# Patient Record
Sex: Female | Born: 1980 | Smoking: Never smoker
Health system: Southern US, Community
[De-identification: ages and names within clinical notes are randomized; demographics above are authoritative.]

## PROBLEM LIST (undated history)

## (undated) DIAGNOSIS — Z789 Other specified health status: Secondary | ICD-10-CM

## (undated) HISTORY — PX: DILATION AND CURETTAGE OF UTERUS: SHX78

---

## 2014-04-09 ENCOUNTER — Other Ambulatory Visit (HOSPITAL_COMMUNITY)
Admission: RE | Admit: 2014-04-09 | Discharge: 2014-04-09 | Disposition: A | Discharge status: Home / Self Care | Payer: BC Managed Care – PPO | Source: Ambulatory Visit | Attending: Obstetrics and Gynecology | Admitting: Obstetrics and Gynecology

## 2014-04-09 DIAGNOSIS — Z01419 Encounter for gynecological examination (general) (routine) without abnormal findings: Secondary | ICD-10-CM | POA: Insufficient documentation

## 2014-04-09 DIAGNOSIS — Z1151 Encounter for screening for human papillomavirus (HPV): Secondary | ICD-10-CM | POA: Insufficient documentation

## 2014-11-03 ENCOUNTER — Ambulatory Visit
Admission: RE | Admit: 2014-11-03 | Discharge: 2014-11-03 | Disposition: A | Discharge status: Home / Self Care | Payer: BC Managed Care – PPO | Source: Ambulatory Visit | Attending: Family Medicine | Admitting: Family Medicine

## 2014-11-03 ENCOUNTER — Other Ambulatory Visit: Payer: Self-pay | Admitting: Family Medicine

## 2014-11-03 DIAGNOSIS — M545 Low back pain: Secondary | ICD-10-CM

## 2015-02-11 ENCOUNTER — Other Ambulatory Visit: Payer: Self-pay | Admitting: Family Medicine

## 2015-02-11 DIAGNOSIS — R748 Abnormal levels of other serum enzymes: Secondary | ICD-10-CM

## 2015-02-16 ENCOUNTER — Ambulatory Visit
Admission: RE | Admit: 2015-02-16 | Discharge: 2015-02-16 | Disposition: A | Discharge status: Home / Self Care | Payer: BLUE CROSS/BLUE SHIELD | Source: Ambulatory Visit | Attending: Family Medicine | Admitting: Family Medicine

## 2015-02-16 DIAGNOSIS — R748 Abnormal levels of other serum enzymes: Secondary | ICD-10-CM

## 2015-02-16 MED ORDER — IOPAMIDOL (ISOVUE-300) INJECTION 61%
100.0000 mL | Freq: Once | INTRAVENOUS | Status: AC | PRN
  Administered 2015-02-16: 100 mL via INTRAVENOUS

## 2015-06-28 ENCOUNTER — Other Ambulatory Visit: Payer: Self-pay | Admitting: Gastroenterology

## 2015-06-28 DIAGNOSIS — R1011 Right upper quadrant pain: Secondary | ICD-10-CM

## 2015-06-28 DIAGNOSIS — R112 Nausea with vomiting, unspecified: Secondary | ICD-10-CM

## 2015-07-18 ENCOUNTER — Ambulatory Visit (HOSPITAL_COMMUNITY): Payer: BLUE CROSS/BLUE SHIELD

## 2015-07-18 ENCOUNTER — Encounter (HOSPITAL_COMMUNITY): Payer: BLUE CROSS/BLUE SHIELD | Attending: Gastroenterology

## 2015-08-18 LAB — OB RESULTS CONSOLE HIV ANTIBODY (ROUTINE TESTING): HIV: NONREACTIVE

## 2015-08-18 LAB — OB RESULTS CONSOLE HEPATITIS B SURFACE ANTIGEN: HEP B S AG: NEGATIVE

## 2015-08-18 LAB — OB RESULTS CONSOLE ANTIBODY SCREEN: ANTIBODY SCREEN: NEGATIVE

## 2015-08-18 LAB — OB RESULTS CONSOLE RPR: RPR: NONREACTIVE

## 2015-08-18 LAB — OB RESULTS CONSOLE RUBELLA ANTIBODY, IGM: RUBELLA: IMMUNE

## 2015-08-18 LAB — OB RESULTS CONSOLE ABO/RH: RH Type: POSITIVE

## 2015-09-07 ENCOUNTER — Other Ambulatory Visit: Payer: Self-pay | Admitting: Obstetrics and Gynecology

## 2015-09-07 ENCOUNTER — Other Ambulatory Visit (HOSPITAL_COMMUNITY)
Admission: RE | Admit: 2015-09-07 | Discharge: 2015-09-07 | Disposition: A | Discharge status: Home / Self Care | Payer: BLUE CROSS/BLUE SHIELD | Source: Ambulatory Visit | Attending: Obstetrics and Gynecology | Admitting: Obstetrics and Gynecology

## 2015-09-07 DIAGNOSIS — Z01419 Encounter for gynecological examination (general) (routine) without abnormal findings: Secondary | ICD-10-CM | POA: Diagnosis present

## 2015-09-07 DIAGNOSIS — Z1151 Encounter for screening for human papillomavirus (HPV): Secondary | ICD-10-CM | POA: Insufficient documentation

## 2015-09-07 DIAGNOSIS — Z113 Encounter for screening for infections with a predominantly sexual mode of transmission: Secondary | ICD-10-CM | POA: Diagnosis present

## 2015-09-12 LAB — CYTOLOGY - PAP

## 2015-11-23 ENCOUNTER — Ambulatory Visit: Payer: BLUE CROSS/BLUE SHIELD

## 2015-11-29 ENCOUNTER — Ambulatory Visit: Payer: BLUE CROSS/BLUE SHIELD | Admitting: Physical Therapy

## 2015-11-30 ENCOUNTER — Ambulatory Visit: Payer: BLUE CROSS/BLUE SHIELD | Attending: Obstetrics and Gynecology | Admitting: Physical Therapy

## 2015-11-30 DIAGNOSIS — R293 Abnormal posture: Secondary | ICD-10-CM

## 2015-11-30 DIAGNOSIS — M5441 Lumbago with sciatica, right side: Secondary | ICD-10-CM | POA: Diagnosis present

## 2015-11-30 DIAGNOSIS — R29898 Other symptoms and signs involving the musculoskeletal system: Secondary | ICD-10-CM

## 2015-11-30 NOTE — Patient Instructions (Signed)
  Backward Bend (Standing)   Arch backward to make hollow of back deeper. Hold _1-2__ seconds. Repeat __10-15__ times per set. Do __1__ sets per session. Do _3-5___ sessions per day.  Copyright  VHI. All rights reserved.

## 2015-11-30 NOTE — Therapy (Signed)
[facility] [address] [place], [place], [address] Phone: [phone]   Fax:  [phone]  Physical Therapy Evaluation  Patient Details  Name: Tracy Gonzalez MRN: [id number] Date of Birth: June 23, 1981 Referring Provider: Gerald Leitz, MD  Encounter Date: 11/30/2015      PT End of Session - 11/30/15 1641    Visit Number 1   Number of Visits 16   Date for PT Re-Evaluation 01/25/16   PT Start Time 1555   PT Stop Time 1638   PT Time Calculation (min) 43 min   Activity Tolerance Patient tolerated treatment well;Patient limited by pain   Behavior During Therapy [facility] for tasks assessed/performed      No past medical history on file.  No past surgical history on file.  There were no vitals filed for this visit.  Visit Diagnosis:  Right-sided low back pain with right-sided sciatica - Plan: PT plan of care cert/re-cert  Weakness of right lower extremity - Plan: PT plan of care cert/re-cert  Abnormal posture - Plan: PT plan of care cert/re-cert      Subjective Assessment - 11/30/15 1559    Subjective Pt is a 34 y/o female who presents to OPPT with LBP x 10 years with increasing pain.  Pt is 24w pregnant as of evaluation, due 03/17/16.     Patient is accompained by: Family member  husband   Pertinent History [redacted] weeks pregnant   Limitations Standing;Walking   How long can you stand comfortably? 1-2 hours   How long can you walk comfortably? 10-15 min   Patient Stated Goals improve pain, ADLs and housework   Currently in Pain? Yes   Pain Score 6   worst: 8/10; best:4/10   Pain Location Back   Pain Orientation Right;Lower   Pain Descriptors / Indicators Constant   Pain Type Chronic pain   Pain Radiating Towards RLE to big toe   Pain Onset More than a month ago   Pain Frequency Constant   Aggravating Factors  standing, stairs, bending, standing after sitting for too long   Pain Relieving Factors repositioning, elevating LEs             [facility] PT Assessment - 11/30/15 1606    Assessment   Medical Diagnosis LBP, sciatica   Referring Provider Gerald Leitz, MD   Onset Date/Surgical Date --  10 year hx   Next MD Visit 4 weeks   Prior Therapy n/a   Precautions   Precautions Other (comment)   Precaution Comments pt is 24w pregnant   Balance Screen   Has the patient fallen in the past 6 months No   Has the patient had a decrease in activity level because of a fear of falling?  No   Is the patient reluctant to leave their home because of a fear of falling?  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   Vocation Requirements cares for 85 year old daughter   Cognition   Overall Cognitive Status Within Functional Limits for tasks assessed   Observation/Other Assessments   Focus on Therapeutic Outcomes (FOTO)  33 (67% limited; predicted 43% limited)   Posture/Postural Control   Posture/Postural Control Postural limitations   Postural Limitations Increased lumbar lordosis   AROM   AROM Assessment Site Lumbar   Lumbar Flexion 78  pain with returning to  neutral   Lumbar Extension 20   Lumbar - Right Side Bend 38   Lumbar - Left Side Bend 32   Strength   Overall Strength Comments limited assessment due to pregnancy; suspect R hip weakness as well   Strength Assessment Site Hip;Knee;Ankle   Right Hip Flexion 4/5   Left Hip Flexion 5/5   Right/Left Knee Right;Left   Right Knee Flexion 3+/5  tested in sitting   Right Knee Extension 5/5   Left Knee Flexion 5/5   Left Knee Extension 5/5   Right Ankle Dorsiflexion 4/5   Left Ankle Dorsiflexion 5/5   Flexibility   Soft Tissue Assessment /Muscle Length yes   Hamstrings tightness on R   Piriformis tightness on R   Palpation   Palpation comment tenderness along R SIJ and piriformis; hips level   Special Tests    Special Tests Lumbar    Lumbar Tests Straight Leg Raise   Straight Leg Raise   Findings Negative   Side  Right   Comment increased tightness noted                           PT Education - 11/30/15 1640    Education provided Yes   Education Details prenatal yoga; standing extension HEP   Person(s) Educated Patient   Methods Explanation;Demonstration;Handout   Comprehension Verbalized understanding;Returned demonstration;Need further instruction          PT Short Term Goals - 11/30/15 1644    PT SHORT TERM GOAL #1   Title verbalize understanding of posture/body mechanics to decrease risk of reinjury (12/29/15)   Time 4   Period Weeks   Status New   PT SHORT TERM GOAL #2   Title report pain < 5/10 with light housework for improved function (12/29/15)   Time 4   Period Weeks   Status New           PT Long Term Goals - 11/30/15 1645    PT LONG TERM GOAL #1   Title independent with HEP (01/25/16)   Time 8   Period Weeks   Status New   PT LONG TERM GOAL #2   Title report ability to walk > 30 min without increase in pain for improved mobility (01/25/16)   Time 8   Period Weeks   Status New   PT LONG TERM GOAL #3   Title perform lumbar ROM without increase in pain (01/25/16)   Time 8   Period Weeks   Status New   PT LONG TERM GOAL #4   Title report pain < 3/10 with housework for improved function (01/25/16)   Time 8   Period Weeks   Status New               Plan - 11/30/15 1641    Clinical Impression Statement Pt is a 34 y/o female who presents to OPPT with 10 year hx of LBP with RLE radicuar symptoms exacerbated by current pregnancy.  Pt demonstrated pain with lumbar ROM and decreased strength in RLE.  Pt will benefit from PT to maximize function and improve pain.   Pt will benefit from skilled therapeutic intervention in order to improve on the following deficits Pain;Postural dysfunction;Decreased strength;Decreased mobility;Decreased activity tolerance   Rehab  Potential Good   PT Frequency 2x / week   PT Duration 8 weeks   PT Treatment/Interventions ADLs/Self Care Home Management;Cryotherapy;Moist Heat;Therapeutic exercise;Therapeutic activities;Functional mobility training;Stair training;Patient/family education;Manual techniques  PT Next Visit Plan si stabilization, core strengthening   Consulted and Agree with Plan of Care Patient         Problem List There are no active problems to display for this patient.  Tracy Gonzalez, PT, DPT 11/30/2015 4:49 PM  [facility] [address] [place], [place], [address] Phone: [phone]   Fax:  [phone]  Name: Tracy Gonzalez MRN: [id number] Date of Birth: July 14, 1981

## 2015-12-09 ENCOUNTER — Ambulatory Visit: Payer: BLUE CROSS/BLUE SHIELD | Admitting: Physical Therapy

## 2015-12-09 DIAGNOSIS — R29898 Other symptoms and signs involving the musculoskeletal system: Secondary | ICD-10-CM

## 2015-12-09 DIAGNOSIS — M5441 Lumbago with sciatica, right side: Secondary | ICD-10-CM | POA: Diagnosis not present

## 2015-12-09 NOTE — Therapy (Signed)
[facility] [address] [place], [place], [address] Phone: [phone]   Fax:  [phone]  Physical Therapy Treatment  Patient Details  Name: Tracy RampRahmet Matzen MRN: [id number] Date of Birth: Feb 01, 1981 Referring Provider: Gerald Leitzara Cole, MD  Encounter Date: 12/09/2015      PT End of Session - 12/09/15 1210    Visit Number 2   Number of Visits 16   PT Start Time 1022   PT Stop Time 1100   PT Time Calculation (min) 38 min      No past medical history on file.  No past surgical history on file.  There were no vitals filed for this visit.  Visit Diagnosis:  Right-sided low back pain with right-sided sciatica  Weakness of right lower extremity      Subjective Assessment - 12/09/15 1023    Subjective Has been doing home exercises standing extension.  helped a little but not much     Currently in Pain? Yes   Pain Location Back   Pain Orientation Right;Left   Pain Descriptors / Indicators Constant   Pain Radiating Towards RT leg   Pain Frequency Constant   Aggravating Factors  Standing   Pain Relieving Factors sitting briefly                         [facility] Adult PT Treatment/Exercise - 12/09/15 0001    Knee/Hip Exercises: Seated   Ball Squeeze --  tried did not help pain   Clamshell with TheraBand --  10 X red band   Marching Limitations Isometric hip flexion LT 10 X added to home   Hamstring Curl 10 reps  issued for home red band, lt only   Abd/Adduction Limitations red band issued,  helped pain some assed to home     Self care.  Skeleton used to explain SI Issues.           PT Education - 12/09/15 1209    Education provided Yes   Education Details hamstring culr LT, hip flex isometric LT, hip abduction   Person(s) Educated Patient;Spouse   Methods Explanation;Demonstration   Comprehension Verbalized understanding          PT Short Term Goals - 12/09/15 1215    PT SHORT TERM GOAL  #1   Title verbalize understanding of posture/body mechanics to decrease risk of reinjury (12/29/15)   Time 4   Period Weeks   Status On-going   PT SHORT TERM GOAL #2   Title report pain < 5/10 with light housework for improved function (12/29/15)   Baseline varies sometimes 9/10   Time 4   Period Weeks   Status On-going           PT Long Term Goals - 11/30/15 1645    PT LONG TERM GOAL #1   Title independent with HEP (01/25/16)   Time 8   Period Weeks   Status New   PT LONG TERM GOAL #2   Title report ability to walk > 30 min without increase in pain for improved mobility (01/25/16)   Time 8   Period Weeks   Status New   PT LONG TERM GOAL #3   Title perform lumbar ROM without increase in pain (01/25/16)   Time 8   Period Weeks   Status New   PT LONG TERM GOAL #4   Title report pain < 3/10 with housework for improved function (01/25/16)   Time 8  Period Weeks   Status New               Plan - 12/09/15 1211    Clinical Impression Statement some exercises helped symptoms so added them to home exercise.  Rt ASIS seemed a little posterior today.  .  Pain unchanged at the end of the session.   PT Next Visit Plan review stabilization   PT Home Exercise Plan isometric hip flexion, band abd, hamstring curl LT   Consulted and Agree with Plan of Care Patient;Family member/caregiver   Family Member Consulted Husband        Problem List There are no active problems to display for this patient.   [facility] 12/09/2015, 12:17 PM  [facility] [address] [place], [place], [address] Phone: [phone]   Fax:  [phone]  Name: Lendy Dittrich MRN: [id number] Date of Birth: 1981-09-08    Liz Beach, PTA 12/09/2015 12:17 PM Phone: [phone] Fax: [phone]

## 2015-12-15 ENCOUNTER — Ambulatory Visit: Payer: BLUE CROSS/BLUE SHIELD | Admitting: Physical Therapy

## 2015-12-15 DIAGNOSIS — M5441 Lumbago with sciatica, right side: Secondary | ICD-10-CM | POA: Diagnosis not present

## 2015-12-15 DIAGNOSIS — R29898 Other symptoms and signs involving the musculoskeletal system: Secondary | ICD-10-CM

## 2015-12-15 DIAGNOSIS — R293 Abnormal posture: Secondary | ICD-10-CM

## 2015-12-15 NOTE — Patient Instructions (Addendum)
   Copyright  VHI. All rights reserved.  Abdominal Bracing With Pelvic Floor (Hook-Lying)    With neutral spine, tighten pelvic floor and abdominals. Repeat _10__ times. Do _2__ times a day. CAn also do on your side if that is more comfy  Copyright  VHI. All rights reserved.  Abduction: Clam (Eccentric) - Side-Lying    Lie on side with knees bent. Lift top knee, keeping feet together. Keep trunk steady. Slowly lower for 3-5 seconds. _10-20__ reps per set, __1-2_ sets per day, __5_ days per week.   http://ecce.exer.us/65   Copyright  VHI. All rights reserved.  Angry Cat Stretch    Tuck chin and tighten stomach, arching back. Repeat __10__ times per set. Do __1__ sets per session. Do __2__ sessions per day.  http://orth.exer.us/119   Copyright  VHI. All rights reserved.

## 2015-12-15 NOTE — Therapy (Signed)
[facility] [place] [facility] [address] [place], [place], [address] Phone: [phone]   Fax:  [phone]  Physical Therapy Treatment  Patient Details  Name: Tracy Gonzalez MRN: [id number] Date of Birth: [birth year] Referring Provider: Gerald Leitz, MD  Encounter Date: 12/15/2015      PT End of Session - 12/15/15 1224    Visit Number 3   Number of Visits 16   Date for PT Re-Evaluation 01/25/16   PT Start Time 1116   PT Stop Time 1215   PT Time Calculation (min) 59 min   Activity Tolerance Patient tolerated treatment well   Behavior During Therapy Tracy Gonzalez for tasks assessed/performed      No past medical history on file.  No past surgical history on file.  There were no vitals filed for this visit.  Visit Diagnosis:  Right-sided low back pain with right-sided sciatica  Weakness of right lower extremity  Abnormal posture      Subjective Assessment - 12/15/15 1117    Subjective I always have pain.    Currently in Pain? Yes   Pain Score 4    Pain Location Back   Pain Orientation Right   Pain Descriptors / Indicators Constant;Aching   Pain Type Chronic pain   Pain Onset More than a month ago   Pain Frequency Constant   Aggravating Factors  sitting long periods    Pain Relieving Factors heat,    Multiple Pain Sites No           [facility] Adult PT Treatment/Exercise - 12/15/15 1235    Self-Care   Posture sitting with lumbar support, firm surface   Other Self-Care Comments  tennis ball, HEP and plan for post baby   Lumbar Exercises: Supine   Ab Set 10 reps   AB Set Limitations done in semi-reclined and sidelying   Clam 10 reps   Clam Limitations sidelying   Other Supine Lumbar Exercises A/P pelvic tilt x 10    Lumbar Exercises: Quadruped   Madcat/Old Horse 5 reps   Madcat/Old Horse Limitations HEP   Moist Heat Therapy   Number Minutes Moist Heat 15 Minutes   Moist Heat Location Lumbar Spine;Hip   Manual Therapy   Soft  tissue mobilization Rt. gluteal and lumbar parapsinal   Myofascial Release Rt. trunk in sidelying        Pt with multiple questions regarding condition, anatomy           PT Education - 12/15/15 1220    Education provided Yes   Education Details anatomy (L spine, SIJ) and tennis ball for husband to massage, options for post. baby   Person(s) Educated Patient;Spouse   Methods Explanation;Demonstration   Comprehension Verbalized understanding;Returned demonstration          PT Short Term Goals - 12/15/15 1231    PT SHORT TERM GOAL #1   Title verbalize understanding of posture/body mechanics to decrease risk of reinjury (12/29/15)   Status On-going   PT SHORT TERM GOAL #2   Title report pain < 5/10 with light housework for improved function (12/29/15)   Status On-going           PT Long Term Goals - 12/15/15 1231    PT LONG TERM GOAL #1   Title independent with HEP (01/25/16)   Status On-going   PT LONG TERM GOAL #2   Title report ability to walk > 30 min without increase in pain for improved mobility (01/25/16)   Status On-going  PT LONG TERM GOAL #3   Title perform lumbar ROM without increase in pain (01/25/16)   Status On-going   PT LONG TERM GOAL #4   Title report pain < 3/10 with housework for improved function (01/25/16)   Status On-going               Plan - 12/15/15 1225    Clinical Impression Statement Patient cont with pain, recently drove from [place] and even with stopping had severe back and leg pain.  Today she appeared symmetrical.  Able to begin basic stabilization, responded well to gentle soft tissue to Rt. gluteal/lumbar.   PT Next Visit Plan review stabilization and answer questions, ask about lifting (does she lift and how)   PT Home Exercise Plan isometric hip flexion, band abd, hamstring curl LT, gave isometric Tr A , sidelying clam and quadruped   Consulted and Agree with Plan of Care Patient;Family member/caregiver   Family Member  Consulted Husband        Problem List There are no active problems to display for this patient.   PAA,JENNIFER 12/15/2015, 12:39 PM  [facility] [address] [place], [place], [address] Phone: [phone]   Fax:  [phone]  Name: Tracy Gonzalez MRN: [id number] Date of Birth: October 26, 1981   Karie MainlandJennifer Paa, PT 12/15/2015 12:45 PM Phone: [phone] Fax: [phone]

## 2015-12-18 NOTE — L&D Delivery Note (Addendum)
Delivery Note At 3:11 AM, on March 21, 2016, a viable female "Halil Darin Engelsbraham" was delivered via Vaginal, Spontaneous Delivery (Presentation: Left Occiput Anterior with restitution to LOT). After delivery of head, bulb suction given on perineum for moderate amt of MSAF. Shoulders delivered easily and infant with good tone and spontaneous cry. Tactile stimulation given by provider and infant placed on mother's abdomen where nurse continued tactile stimulation.  Infant APGAR: 8, 9. Cord clamped, cut, and blood collected. Placenta delivered spontaneously and noted to be intact with 3VC upon inspection.  Vaginal inspection revealed a 2nd degree perineal laceration that was repaired with 3-0 vicryl on CT-1. Local anesthetic was necessary and 1% lidocaine was given. Patient tolerated the procedure well. Fundus firm, below the umbilicus, and bleeding scant.  Mother hemodynamically stable and infant skin to skin prior to provider exit.  Mother desires for birth control not addressed, but she does opt to breastfeed.  Family wishes for infant to be circumcised during inpatient stay. Infant weight at one hour of life: 8lb 2.5oz, 21in.   Anesthesia: Local  Episiotomy: None Lacerations: 2nd degree Suture Repair: 3.0 vicryl Est. Blood Loss (mL): 50  Mom to postpartum.  Baby to Couplet care / Skin to Skin.  Cherre RobinsJessica L Cailee Blanke MSN, CNM 03/21/2016, 4:02 AM

## 2015-12-20 ENCOUNTER — Ambulatory Visit: Payer: BLUE CROSS/BLUE SHIELD | Attending: Obstetrics and Gynecology | Admitting: Physical Therapy

## 2015-12-20 DIAGNOSIS — R29898 Other symptoms and signs involving the musculoskeletal system: Secondary | ICD-10-CM | POA: Diagnosis present

## 2015-12-20 DIAGNOSIS — M5441 Lumbago with sciatica, right side: Secondary | ICD-10-CM | POA: Diagnosis not present

## 2015-12-20 DIAGNOSIS — R293 Abnormal posture: Secondary | ICD-10-CM | POA: Insufficient documentation

## 2015-12-20 NOTE — Therapy (Signed)
[place], [place], [address] Phone: [phone]   Fax:  [phone]  Physical Therapy Treatment  Patient Details  Name: Tracy Gonzalez MRN: [id number] Date of Birth: [birth year] Referring Provider: Christophe Louis, MD  Encounter Date: 12/20/2015      PT End of Session - 12/20/15 1602    Visit Number 4   Number of Visits 16   Date for PT Re-Evaluation 01/25/16   PT Start Time 0254   PT Stop Time 1630   PT Time Calculation (min) 45 min   Activity Tolerance Patient tolerated treatment well   Behavior During Therapy [facility] for tasks assessed/performed      No past medical history on file.  No past surgical history on file.  There were no vitals filed for this visit.  Visit Diagnosis:  Right-sided low back pain with right-sided sciatica  Weakness of right lower extremity  Abnormal posture      Subjective Assessment - 12/20/15 1550    Subjective Is a little bit worse.  Was traveling and had a houseguest recently so overdid it.  Had a hard time doing sidelying clam.  Difficulty getting out of bed in the am, takes a few min to be able to walk normally. Has had back pain for yrs but only in the past month has it been in post hip and buttocks as much as this.    Currently in Pain? Yes   Pain Score 4    Pain Location Back   Pain Orientation Right   Pain Type Chronic pain   Pain Onset More than a month ago   Pain Frequency Constant   Aggravating Factors  standing, doing too much, sitting too much   Pain Relieving Factors lying on side, heat, rest                         [facility] Adult PT Treatment/Exercise - 12/20/15 1600    Self-Care   Posture handouts, breif review      Other Self-Care Comments  lifting with golfer's technique, can demo    Lumbar Exercises: Supine   Ab Set 10 reps  ball squeeze   AB Set Limitations semi-reclined with wedge   Clam 20 reps   Clam Limitations red band in  supine    Other Supine Lumbar Exercises A/P pelvic tilt x 10    Other Supine Lumbar Exercises sidelying QL stretch, glute stretch    Cryotherapy   Number Minutes Cryotherapy 10 Minutes   Cryotherapy Location Hip   Type of Cryotherapy Ice pack   Manual Therapy   Manual therapy comments compression to Rt. ischial tuberosity, hamstring and lateral SI border, piriformis   Soft tissue mobilization Rt. gluteal   Myofascial Release Rt. trunk in sidelying      Educated on lifting, posture/self care as ice pack applied Advised to wear SI belt as needed when up and out of bed.  Stressed relief of pain at this point due to pregnancy, need to rest.            PT Education - 12/20/15 1559    Education provided Yes   Education Details lifting principles, body mech   Person(s) Educated Patient;Spouse   Methods Explanation;Demonstration;Handout   Comprehension Verbalized understanding;Returned demonstration          PT Short Term Goals - 12/15/15 1231    PT SHORT TERM GOAL #1   Title verbalize understanding of posture/body mechanics  to decrease risk of reinjury (12/29/15)   Status On-going   PT SHORT TERM GOAL #2   Title report pain < 5/10 with light housework for improved function (12/29/15)   Status On-going           PT Long Term Goals - 12/15/15 1231    PT LONG TERM GOAL #1   Title independent with HEP (01/25/16)   Status On-going   PT LONG TERM GOAL #2   Title report ability to walk > 30 min without increase in pain for improved mobility (01/25/16)   Status On-going   PT LONG TERM GOAL #3   Title perform lumbar ROM without increase in pain (01/25/16)   Status On-going   PT LONG TERM GOAL #4   Title report pain < 3/10 with housework for improved function (01/25/16)   Status On-going               Plan - 12/20/15 2206    Clinical Impression Statement Pt today appears Rt. anterior innominate rotation.  worked on [employer] and positioning to relive pain, educated on Office manager  for lifting.  Trial of ice due to inflammatory response to palpation of Rt. hip mm.    PT Next Visit Plan review stabilization and answer questions, ask about lifting (does she lift and how). try a MET (resisted extension) in supine followed by passive hip flexion/post innominate, needs seated hip stretch (Ham, piriformis)   PT Home Exercise Plan (stopped isometirc hip flexion due to incr pain),  clam in supine,  hamstring curl LT, gave isometric Tr A , sidelying clam and quadruped   Consulted and Agree with Plan of Care Patient;Family member/caregiver        Problem List There are no active problems to display for this patient.   PAA,JENNIFER 12/20/2015, 10:18 PM  [place], [place], [address] Phone: [phone]   Fax:  [phone]  Name: Tracy Gonzalez MRN: [id number] Date of Birth: 09/04/1981   Raeford Razor, PT 12/20/2015 10:23 PM Phone: [phone] Fax: [phone]

## 2015-12-20 NOTE — Patient Instructions (Addendum)

## 2015-12-22 ENCOUNTER — Encounter: Payer: BLUE CROSS/BLUE SHIELD | Admitting: Physical Therapy

## 2015-12-27 ENCOUNTER — Ambulatory Visit: Payer: BLUE CROSS/BLUE SHIELD | Admitting: Physical Therapy

## 2015-12-27 DIAGNOSIS — R29898 Other symptoms and signs involving the musculoskeletal system: Secondary | ICD-10-CM

## 2015-12-27 DIAGNOSIS — M5441 Lumbago with sciatica, right side: Secondary | ICD-10-CM | POA: Diagnosis not present

## 2015-12-27 DIAGNOSIS — R293 Abnormal posture: Secondary | ICD-10-CM

## 2015-12-27 NOTE — Therapy (Signed)
[place], [place], [address] Phone: [phone]   Fax:  [phone]  Physical Therapy Treatment  Patient Details  Name: Tracy Gonzalez MRN: [id number] Date of Birth: 1981/02/06 Referring Provider: Christophe Louis, MD  Encounter Date: 12/27/2015      PT End of Session - 12/27/15 1600    Visit Number 5   Number of Visits 16   Date for PT Re-Evaluation 01/25/16   PT Start Time 8185   PT Stop Time 1640   PT Time Calculation (min) 53 min   Activity Tolerance Patient tolerated treatment well   Behavior During Therapy [facility] for tasks assessed/performed      No past medical history on file.  No past surgical history on file.  There were no vitals filed for this visit.  Visit Diagnosis:  Right-sided low back pain with right-sided sciatica  Weakness of right lower extremity  Abnormal posture      Subjective Assessment - 12/27/15 1553    Subjective Pt reports she believes she is better with pain rating 3-4/10 upon arrival. Upon lying supine, pt reports pain increased again.    Currently in Pain? Yes   Pain Score 3    Pain Location Back   Pain Orientation Right   Pain Descriptors / Indicators Aching;Constant   Pain Type Chronic pain              [facility] Adult PT Treatment/Exercise - 12/27/15 1555    Self-Care   Self-Care Other Self-Care Comments   Posture used time during manual to discuss tools for home use for relieving pain in Rt. glute   Other Self-Care Comments  exercise techniue and rationale  pain with exercise, advised to work through discomfort, RICE   Lumbar Exercises: Supine   Ab Set 10 reps  ball squeeze   AB Set Limitations supine   Clam --   Clam Limitations --   Heel Slides 10 reps  with verbal cues for breathing technique   Bent Knee Raise 10 reps   Bridge 10 reps   Bridge Limitations with belt to stabilize   Other Supine Lumbar Exercises A/P pelvic tilt x 10    Other  Supine Lumbar Exercises LTR x 10 reps    Lumbar Exercises: Sidelying   Clam 10 reps   Clam Limitations red   Hip Abduction 10 reps   Hip Abduction Weights (lbs) red band   Hip Abduction Limitations knee and hip flexed   Knee/Hip Exercises: Stretches   Active Hamstring Stretch 30 seconds   Active Hamstring Stretch Limitations 3, reps , bilat   Piriformis Stretch 3 reps;30 seconds   Piriformis Stretch Limitations --   Gastroc Stretch 3 reps;30 seconds   Gastroc Stretch Limitations --   Cryotherapy   Number Minutes Cryotherapy 10 Minutes   Cryotherapy Location Hip   Type of Cryotherapy Ice pack   Manual Therapy   Soft tissue mobilization R glute in sidelying   Myofascial Release R glute with rolling stick                PT Education - 12/27/15 1559    Education provided Yes   Education Details breath control during ther ex, HEP with piriformis stretch   Person(s) Educated Patient;Spouse   Methods Explanation;Demonstration   Comprehension Verbalized understanding;Returned demonstration          PT Short Term Goals - 12/27/15 1946    PT SHORT TERM GOAL #1   Title verbalize understanding  of posture/body mechanics to decrease risk of reinjury (12/29/15)   Status Achieved   PT SHORT TERM GOAL #2   Title report pain < 5/10 with light housework for improved function (12/29/15)   Baseline varies, ongoing for consistency   Status On-going           PT Long Term Goals - 12/27/15 1946    PT LONG TERM GOAL #1   Title independent with HEP (01/25/16)   Status On-going   PT LONG TERM GOAL #2   Title report ability to walk > 30 min without increase in pain for improved mobility (01/25/16)   Status On-going   PT LONG TERM GOAL #3   Title perform lumbar ROM without increase in pain (01/25/16)   Status On-going   PT LONG TERM GOAL #4   Title report pain < 3/10 with housework for improved function (01/25/16)   Status On-going               Plan - 12/27/15 1944     Clinical Impression Statement Pt was able to rest this week and appears more comfortable. Has been wearing belt.  Pain increased with gentle stabilization ex.  Has difficulty maintaining neutral pelvic with stab ex.  Ice reduced pain post treatment.     PT Next Visit Plan review stabilization and answer questions, ask about lifting (does she lift and how). try a MET (resisted extension) in supine followed by passive hip flexion/post innominate, needs seated hip stretch (Ham, piriformis)   PT Home Exercise Plan (stopped isometirc hip flexion due to incr pain),  clam in supine,  hamstring curl LT, gave isometric Tr A , sidelying clam and quadruped, piriformis seated   Consulted and Agree with Plan of Care Patient;Family member/caregiver        Problem List There are no active problems to display for this patient.   PAA,JENNIFER 12/27/2015, 7:52 PM  [facility] [address] [place], [place], [address] Phone: [phone]   Fax:  [phone]  Name: Tracy Gonzalez MRN: [id number] Date of Birth: Nov 14, 1981  Raeford Razor, PT 12/27/2015 7:53 PM Phone: [phone] Fax: [phone]

## 2015-12-29 ENCOUNTER — Ambulatory Visit: Payer: BLUE CROSS/BLUE SHIELD | Admitting: Physical Therapy

## 2015-12-29 DIAGNOSIS — R29898 Other symptoms and signs involving the musculoskeletal system: Secondary | ICD-10-CM

## 2015-12-29 DIAGNOSIS — M5441 Lumbago with sciatica, right side: Secondary | ICD-10-CM | POA: Diagnosis not present

## 2015-12-29 DIAGNOSIS — R293 Abnormal posture: Secondary | ICD-10-CM

## 2015-12-30 NOTE — Therapy (Addendum)
[place], [place], [address] Phone: [phone]   Fax:  [phone]  Physical Therapy Treatment  Patient Details  Name: Tracy Gonzalez MRN: [id number] Date of Birth: 1981/02/03 Referring Provider: Christophe Louis, MD  Encounter Date: 12/29/2015      PT End of Session - 12/30/15 0801    Visit Number 6   Number of Visits 16   Date for PT Re-Evaluation 01/25/16   PT Start Time 0177   PT Stop Time 1636   PT Time Calculation (min) 51 min   Activity Tolerance Patient tolerated treatment well   Behavior During Therapy [facility] for tasks assessed/performed      No past medical history on file.  No past surgical history on file.  There were no vitals filed for this visit.  Visit Diagnosis:  Right-sided low back pain with right-sided sciatica  Weakness of right lower extremity  Abnormal posture      Subjective Assessment - 12/29/15 1549    Subjective Was doing good until she gave the baby a bath, had to bend and help her. Report bilateral leg cramps at night.     Currently in Pain? Yes   Pain Score 6    Pain Location Back   Pain Orientation Right   Pain Type Chronic pain            [facility] PT Assessment - 12/29/15 1601    Posture/Postural Control   Postural Limitations Increased lumbar lordosis;Left pelvic obliquity   Posture Comments L ASIS higher than Rt. ASIS (Rt. anterior)                      [facility] Adult PT Treatment/Exercise - 12/29/15 1601    Lumbar Exercises: Quadruped   Madcat/Old Horse 10 reps   Madcat/Old Horse Limitations also done laterallty for each side x 3    Plank childs pose see above   wags for lateral flexion x 5 each side    Knee/Hip Exercises: Stretches   Gastroc Stretch 3 reps   Manual Therapy   Manual therapy comments MET for Rt. ant ilium, resisted hip ext and knee flexion in supine, 10 sec x 6    Soft tissue mobilization Rt. Lumbar parapsipinals, Rt. glute  med and piriformis in sidelying    Myofascial Release Rt. trunk                 PT Education - 12/30/15 0800    Education provided Yes   Education Details MET, calf stretch   Person(s) Educated Patient   Methods Explanation   Comprehension Verbalized understanding;Returned demonstration          PT Short Term Goals - 12/27/15 1946    PT SHORT TERM GOAL #1   Title verbalize understanding of posture/body mechanics to decrease risk of reinjury (12/29/15)   Status Achieved   PT SHORT TERM GOAL #2   Title report pain < 5/10 with light housework for improved function (12/29/15)   Baseline varies, ongoing for consistency   Status On-going           PT Long Term Goals - 12/27/15 1946    PT LONG TERM GOAL #1   Title independent with HEP (01/25/16)   Status On-going   PT LONG TERM GOAL #2   Title report ability to walk > 30 min without increase in pain for improved mobility (01/25/16)   Status On-going   PT LONG TERM GOAL #3  Title perform lumbar ROM without increase in pain (01/25/16)   Status On-going   PT LONG TERM GOAL #4   Title report pain < 3/10 with housework for improved function (01/25/16)   Status On-going               Plan - 12/30/15 0802    Clinical Impression Statement Pt with relief of LE pain with manual STM in sidelying.  Understands the need to avoid aggravating factors but with a 35 yr old it is difficult.  Does her HEP everyday.  Trial of MET for Rt. ant, L post ilium today   PT Next Visit Plan review stabilization and answer questions, ask about lifting (does she lift and how). try a MET (resisted extension) in supine followed by passive hip flexion/post innominate, needs seated hip stretch (Ham, piriformis)   PT Home Exercise Plan MET for Rt. ant pelvis, clam in supine,  hamstring curl LT, gave isometric Tr A , sidelying clam and quadruped, piriformis seated, stand calf stretch   Consulted and Agree with Plan of Care Patient;Family member/caregiver    Family Member Consulted Husband        Problem List There are no active problems to display for this patient.   PAA,JENNIFER 12/30/2015, 8:46 AM  [facility] [address] [place], [place], [address] Phone: [phone]   Fax:  [phone]  Name: Jamae Gonzalez MRN: [id number] Date of Birth: [birth year]   Raeford Razor, PT 12/30/2015 8:47 AM Phone: [phone] Fax: [phone]   PHYSICAL THERAPY DISCHARGE SUMMARY  Visits from Start of Care: 6  Current functional level related to goals / functional outcomes: See above   Remaining deficits: unknown   Education / Equipment: HEP, stabilization, posture  Plan: Patient agrees to discharge.  Patient goals were partially met. Patient is being discharged due to not returning since the last visit.  ?????    Raeford Razor, PT 03/16/2016 11:45 AM Phone: [phone] Fax: [phone]

## 2016-01-03 ENCOUNTER — Encounter: Payer: BLUE CROSS/BLUE SHIELD | Admitting: Physical Therapy

## 2016-01-05 ENCOUNTER — Encounter: Payer: BLUE CROSS/BLUE SHIELD | Admitting: Physical Therapy

## 2016-02-20 LAB — OB RESULTS CONSOLE GBS: STREP GROUP B AG: NEGATIVE

## 2016-03-20 ENCOUNTER — Inpatient Hospital Stay (HOSPITAL_COMMUNITY)
Admission: AD | Admit: 2016-03-20 | Discharge: 2016-03-20 | Disposition: A | Discharge status: Home / Self Care | Payer: BLUE CROSS/BLUE SHIELD | Source: Ambulatory Visit | Attending: Obstetrics and Gynecology | Admitting: Obstetrics and Gynecology

## 2016-03-20 ENCOUNTER — Encounter (HOSPITAL_COMMUNITY): Payer: Self-pay | Admitting: *Deleted

## 2016-03-20 ENCOUNTER — Telehealth (HOSPITAL_COMMUNITY): Payer: Self-pay | Admitting: *Deleted

## 2016-03-20 HISTORY — DX: Other specified health status: Z78.9

## 2016-03-20 NOTE — Telephone Encounter (Signed)
Preadmission screen Interpreter number [id number]

## 2016-03-20 NOTE — MAU Note (Signed)
Pt presents complaining of contractions every 5 minutes since this am. Denies leaking or bleeding. Reports good fetal movement. 1cm in office yesterday.

## 2016-03-20 NOTE — Progress Notes (Signed)
May D/C home with labor precautions

## 2016-03-21 ENCOUNTER — Encounter (HOSPITAL_COMMUNITY): Payer: Self-pay

## 2016-03-21 ENCOUNTER — Inpatient Hospital Stay (HOSPITAL_COMMUNITY)
Admission: AD | Admit: 2016-03-21 | Discharge: 2016-03-22 | DRG: 775 | Disposition: A | Discharge status: Home / Self Care | Payer: BLUE CROSS/BLUE SHIELD | Source: Ambulatory Visit | Attending: Obstetrics and Gynecology | Admitting: Obstetrics and Gynecology

## 2016-03-21 DIAGNOSIS — Z8249 Family history of ischemic heart disease and other diseases of the circulatory system: Secondary | ICD-10-CM

## 2016-03-21 DIAGNOSIS — Z3A40 40 weeks gestation of pregnancy: Secondary | ICD-10-CM | POA: Diagnosis not present

## 2016-03-21 DIAGNOSIS — O4202 Full-term premature rupture of membranes, onset of labor within 24 hours of rupture: Principal | ICD-10-CM | POA: Diagnosis present

## 2016-03-21 LAB — CBC
HCT: 35.3 % — ABNORMAL LOW (ref 36.0–46.0)
Hemoglobin: 12.3 g/dL (ref 12.0–15.0)
MCH: 29.6 pg (ref 26.0–34.0)
MCHC: 34.8 g/dL (ref 30.0–36.0)
MCV: 85.1 fL (ref 78.0–100.0)
Platelets: 207 10*3/uL (ref 150–400)
RBC: 4.15 MIL/uL (ref 3.87–5.11)
RDW: 15.8 % — AB (ref 11.5–15.5)
WBC: 8.8 10*3/uL (ref 4.0–10.5)

## 2016-03-21 LAB — TYPE AND SCREEN
ABO/RH(D): B POS
ANTIBODY SCREEN: NEGATIVE

## 2016-03-21 LAB — ABO/RH: ABO/RH(D): B POS

## 2016-03-21 LAB — RPR: RPR Ser Ql: NONREACTIVE

## 2016-03-21 MED ORDER — CITRIC ACID-SODIUM CITRATE 334-500 MG/5ML PO SOLN: 30.0000 mL | ORAL | Status: DC | PRN

## 2016-03-21 MED ORDER — PRENATAL MULTIVITAMIN CH
1.0000 | ORAL_TABLET | Freq: Every day | ORAL | Status: DC
  Administered 2016-03-22: 1 via ORAL
  Filled 2016-03-21 (×2): qty 1

## 2016-03-21 MED ORDER — LANOLIN HYDROUS EX OINT: TOPICAL_OINTMENT | CUTANEOUS | Status: DC | PRN

## 2016-03-21 MED ORDER — SENNOSIDES-DOCUSATE SODIUM 8.6-50 MG PO TABS
2.0000 | ORAL_TABLET | ORAL | Status: DC
  Administered 2016-03-21: 2 via ORAL
  Filled 2016-03-21: qty 2

## 2016-03-21 MED ORDER — SIMETHICONE 80 MG PO CHEW: 80.0000 mg | CHEWABLE_TABLET | ORAL | Status: DC | PRN

## 2016-03-21 MED ORDER — ACETAMINOPHEN 325 MG PO TABS
650.0000 mg | ORAL_TABLET | ORAL | Status: DC | PRN
  Administered 2016-03-21 (×3): 650 mg via ORAL
  Filled 2016-03-21 (×3): qty 2

## 2016-03-21 MED ORDER — BENZOCAINE-MENTHOL 20-0.5 % EX AERO
1.0000 | INHALATION_SPRAY | CUTANEOUS | Status: DC | PRN
Start: 2016-03-21 — End: 2016-03-22
  Administered 2016-03-21: 1 via TOPICAL
  Filled 2016-03-21: qty 56

## 2016-03-21 MED ORDER — FENTANYL CITRATE (PF) 100 MCG/2ML IJ SOLN: 50.0000 ug | INTRAMUSCULAR | Status: DC | PRN

## 2016-03-21 MED ORDER — LACTATED RINGERS IV SOLN
INTRAVENOUS | Status: DC
  Administered 2016-03-21: 03:00:00 via INTRAVENOUS
  Administered 2016-03-21: 125 mL/h via INTRAVENOUS

## 2016-03-21 MED ORDER — ONDANSETRON HCL 4 MG/2ML IJ SOLN: 4.0000 mg | INTRAMUSCULAR | Status: DC | PRN

## 2016-03-21 MED ORDER — OXYTOCIN 10 UNIT/ML IJ SOLN
2.5000 [IU]/h | INTRAVENOUS | Status: DC
  Filled 2016-03-21: qty 4

## 2016-03-21 MED ORDER — PHENYLEPHRINE 40 MCG/ML (10ML) SYRINGE FOR IV PUSH (FOR BLOOD PRESSURE SUPPORT)
PREFILLED_SYRINGE | INTRAVENOUS | Status: AC
  Filled 2016-03-21: qty 20

## 2016-03-21 MED ORDER — ACETAMINOPHEN 325 MG PO TABS: 650.0000 mg | ORAL_TABLET | ORAL | Status: DC | PRN

## 2016-03-21 MED ORDER — LACTATED RINGERS IV SOLN: 500.0000 mL | INTRAVENOUS | Status: DC | PRN

## 2016-03-21 MED ORDER — DIBUCAINE 1 % RE OINT
1.0000 | TOPICAL_OINTMENT | RECTAL | Status: DC | PRN
Start: 2016-03-21 — End: 2016-03-22
  Administered 2016-03-21: 1 via RECTAL
  Filled 2016-03-21: qty 28

## 2016-03-21 MED ORDER — ONDANSETRON HCL 4 MG/2ML IJ SOLN: 4.0000 mg | Freq: Four times a day (QID) | INTRAMUSCULAR | Status: DC | PRN

## 2016-03-21 MED ORDER — ONDANSETRON HCL 4 MG PO TABS: 4.0000 mg | ORAL_TABLET | ORAL | Status: DC | PRN

## 2016-03-21 MED ORDER — DIPHENHYDRAMINE HCL 25 MG PO CAPS: 25.0000 mg | ORAL_CAPSULE | Freq: Four times a day (QID) | ORAL | Status: DC | PRN

## 2016-03-21 MED ORDER — OXYTOCIN BOLUS FROM INFUSION
500.0000 mL | INTRAVENOUS | Status: DC
  Administered 2016-03-21: 500 mL via INTRAVENOUS

## 2016-03-21 MED ORDER — TETANUS-DIPHTH-ACELL PERTUSSIS 5-2.5-18.5 LF-MCG/0.5 IM SUSP: 0.5000 mL | Freq: Once | INTRAMUSCULAR | Status: DC

## 2016-03-21 MED ORDER — FENTANYL 2.5 MCG/ML BUPIVACAINE 1/10 % EPIDURAL INFUSION (WH - ANES)
INTRAMUSCULAR | Status: DC
Start: 2016-03-21 — End: 2016-03-21
  Filled 2016-03-21: qty 125

## 2016-03-21 MED ORDER — LIDOCAINE HCL (PF) 1 % IJ SOLN
30.0000 mL | INTRAMUSCULAR | Status: DC | PRN
  Administered 2016-03-21: 30 mL via SUBCUTANEOUS
  Filled 2016-03-21: qty 30

## 2016-03-21 MED ORDER — WITCH HAZEL-GLYCERIN EX PADS
1.0000 | MEDICATED_PAD | CUTANEOUS | Status: DC | PRN
Start: 2016-03-21 — End: 2016-03-22
  Administered 2016-03-21: 1 via TOPICAL

## 2016-03-21 MED ORDER — IBUPROFEN 600 MG PO TABS
600.0000 mg | ORAL_TABLET | Freq: Four times a day (QID) | ORAL | Status: DC
  Administered 2016-03-21 – 2016-03-22 (×6): 600 mg via ORAL
  Filled 2016-03-21 (×6): qty 1

## 2016-03-21 MED ORDER — ZOLPIDEM TARTRATE 5 MG PO TABS
5.0000 mg | ORAL_TABLET | Freq: Every evening | ORAL | Status: DC | PRN
Start: 2016-03-21 — End: 2016-03-22

## 2016-03-21 NOTE — Lactation Note (Signed)
This note was copied from a baby's chart. Lactation Consultation Note Initial visit at 6 hours of age.  Mom reports 3 good feedings and denies pain.  Mom has 23 months experience with older child breastfeeding. Mom reports baby just finished a 20 minute feeding.  Mom has large irregular shaped nipples and denies pain with latch.  [facility] given and discussed.  Encouraged to feed with early cues on demand and 8-12x/day.  Early newborn behavior discussed.  Hand expression demonstrated by mom with colostrum visible.  Mom to call for assist as needed.     Patient Name: Tracy Gonzalez [place] Today's Date: 03/21/2016 Reason for consult: Initial assessment   Maternal Data Has patient been taught Hand Expression?: Yes Does the patient have breastfeeding experience prior to this delivery?: Yes  Feeding Feeding Type: Breast Fed Length of feed: 15 min  LATCH Score/Interventions                Intervention(s): Breastfeeding basics reviewed;Position options     Lactation Tools Discussed/Used     Consult Status Consult Status: Follow-up Date: 03/22/16 Follow-up type: In-patient    Tracy Gonzalez, Arvella MerlesJana Lynn 03/21/2016, 9:28 AM

## 2016-03-21 NOTE — Progress Notes (Signed)
Gibson RampRahmet Gonzalez MRN: [id number]  Subjective: -Nurse call reports patient with anterior lip and involuntary pushing.  In room to assess.  Patient as stated.  FOB at bedside, supportive.   Objective: LMP 06/11/2015      Fetal Monitoring: FHT: 135 bpm, Mod Var, -Decels, +Accels UC: Q3-435min, palpates moderate    Vaginal Exam: SVE:   Dilation: 6 Effacement (%): 100, 90 Station: -1 Exam by:: Camelia Enganielle Simpson RN Membranes:SROM with moderate MSF noted Internal Monitors: None  Augmentation/Induction: Pitocin:None Cytotec: None  Assessment:  IUP at 40.4wks Cat I FT  Transitional Labor GBS Negative  Plan: -Expectant Mgmt -Anticipate SVD -Continue other mgmt as ordered  Valma CavaJessica L Rianna Lukes,MSN, CNM 03/21/2016, 1:48 AM

## 2016-03-21 NOTE — H&P (Signed)
Tracy Gonzalez is a 35 y.o. female presenting for SROM.  Patient evaluated in MAU earlier this evening and was discharged home in early labor.  Patient reports SROM at 0015 with increase in contraction frequency and intensity.  Patient is a G2P1001 who is under the care of Dr. Delrae Sawyers of [facility].  Patient prenatal history unremarkable besides common pregnancy discomforts. GBS negative and patient desires epidural for pain mgmt.  Patient speaks [place] and husband, Rayetta Pigg, acts as Equities trader.    Maternal Medical History:  Reason for admission: Rupture of membranes and contractions.   Contractions: Onset was 6-12 hours ago.   Frequency: regular.   Perceived severity is moderate.    Fetal activity: Perceived fetal activity is normal.   Last perceived fetal movement was within the past hour.    Prenatal complications: no prenatal complications Prenatal Complications - Diabetes: none.    OB History    Gravida Para Term Preterm AB TAB SAB Ectopic Multiple Living   02/2011: Female via SVD 7lbs 2oz  Past Medical History  Diagnosis Date  . Medical history non-contributory    Past Surgical History  Procedure Laterality Date  . Dilation and curettage of uterus     Family History: family history includes Hypertension in her mother; Urolithiasis in her mother and sister. Social History:  reports that she has never smoked. She has never used smokeless tobacco. She reports that she does not drink alcohol or use illicit drugs.   Prenatal Transfer Tool  Maternal Diabetes: No Genetic Screening: Declined Maternal Ultrasounds/Referrals: Normal Fetal Ultrasounds or other Referrals:  None Maternal Substance Abuse:  No Significant Maternal Medications:  Meds include: Other: Diclegis, Zofran, Tylenol prn Significant Maternal Lab Results:  Lab values include: Group B Strep negative Other Comments:  None  Review of Systems  All other systems reviewed and are  negative.   Dilation: 6 Effacement (%): 100, 90 Station: -1 Exam by:: Camelia Eng RN Last menstrual period 06/11/2015. Maternal Exam:  Uterine Assessment: Contraction strength is moderate.  Contraction frequency is regular.   Abdomen: Patient reports no abdominal tenderness. Fundal height is AGA.   Estimated fetal weight is 8lbs.   Fetal presentation: vertex  Introitus: Amniotic fluid character: meconium stained.  Pelvis: adequate for delivery.   Cervix: Cervix evaluated by digital exam.     Fetal Exam Fetal Monitor Review: Mode: fetal scalp electrode.   Baseline rate: 135.  Variability: moderate (6-25 bpm).   Pattern: variable decelerations.    Fetal State Assessment: Category II - tracings are indeterminate.     Physical Exam  Constitutional: She is oriented to person, place, and time. She appears well-developed and well-nourished. She appears distressed (During ctx).  HENT:  Head: Normocephalic and atraumatic.  Eyes: Conjunctivae are normal.  Neck: Normal range of motion.  Cardiovascular: Normal rate.   Respiratory: Effort normal.  GI: Soft.  Musculoskeletal: Normal range of motion.  Neurological: She is alert and oriented to person, place, and time.  Skin: Skin is warm and dry.    Prenatal labs: ABO, Rh: B/Positive/-- (09/01 0000) Antibody: Negative (09/01 0000) Rubella: Immune (09/01 0000) RPR: Nonreactive (09/01 0000)  HBsAg: Negative (09/01 0000)  HIV: Non-reactive (09/01 0000)  GBS: Negative (03/06 0000)   Assessment/Plan: IUP at 40.4wks Cat I FT Overall SROM Active Labor GBS Negative  Admit to [employer]  Routine Labor and Delivery Orders per [facility] Protocol Expectant Mgmt Okay for Epidural  Dr.EK to be updated as appropriate [facility] to be notified of pt admission and status at 0700  Cherre RobinsJessica L Danyell Awbrey 03/21/2016, 12:55 AM

## 2016-03-22 LAB — CBC
HEMATOCRIT: 33.7 % — AB (ref 36.0–46.0)
HEMOGLOBIN: 11.4 g/dL — AB (ref 12.0–15.0)
MCH: 29.1 pg (ref 26.0–34.0)
MCHC: 33.8 g/dL (ref 30.0–36.0)
MCV: 86 fL (ref 78.0–100.0)
PLATELETS: 200 10*3/uL (ref 150–400)
RBC: 3.92 MIL/uL (ref 3.87–5.11)
RDW: 16.1 % — ABNORMAL HIGH (ref 11.5–15.5)
WBC: 10.2 10*3/uL (ref 4.0–10.5)

## 2016-03-22 MED ORDER — IBUPROFEN 600 MG PO TABS: 600.0000 mg | ORAL_TABLET | Freq: Four times a day (QID) | ORAL | Status: AC | PRN

## 2016-03-22 MED ORDER — OXYCODONE-ACETAMINOPHEN 5-325 MG PO TABS
1.0000 | ORAL_TABLET | Freq: Four times a day (QID) | ORAL | Status: AC | PRN
Start: 2016-03-22 — End: ?

## 2016-03-22 NOTE — Progress Notes (Signed)
Post Partum Day 1 s/p SVD Subjective: no complaints, up ad lib, voiding, tolerating PO and + flatus  Objective: Blood pressure 104/73, pulse 64, temperature 97.4 F (36.3 C), temperature source Oral, resp. rate 18, height 5\' 5"  (1.651 m), weight 150 lb (68.04 kg), last menstrual period 06/11/2015, SpO2 98 %, unknown if currently breastfeeding.  Physical Exam:  General: alert and cooperative Lochia: appropriate Uterine Fundus: firm Incision: NA DVT Evaluation: No evidence of DVT seen on physical exam.   Recent Labs  03/21/16 0100 03/22/16 0528  HGB 12.3 11.4*  HCT 35.3* 33.7*    Assessment/Plan: Breastfeeding and Lactation consult   Circumcision done today Pt desires discharge this afternoon Routine Postpartum care.  PPV with me in 6 weeks    LOS: 1 day   Mahdiya Mossberg J. 03/22/2016, 7:54 AM

## 2016-03-22 NOTE — Lactation Note (Signed)
This note was copied from a baby's chart. Lactation Consultation Note  Patient Name: Tracy Gonzalez [place] Today's Date: 03/22/2016 Reason for consult: Follow-up assessment   With this third time mom, experienced in breastfeeidng. Mom and baby sound asleep. Dad reports breastfeeding going well. I ask him to have mom call for lactation for any questions/concerns.    Maternal Data    Feeding    LATCH Score/Interventions                      Lactation Tools Discussed/Used     Consult Status Consult Status: Complete Follow-up type: Call as needed    Alfred LevinsLee, Thoma Paulsen Anne 03/22/2016, 10:30 AM

## 2016-03-22 NOTE — Discharge Summary (Signed)
OB Discharge Summary     Patient Name: Tracy Gonzalez DOB: 1981/05/29 MRN: [id number]  Date of admission: 03/21/2016 Delivering MD: Gerrit Heck   Date of discharge: 03/22/2016  Admitting diagnosis: 41 WEEKS CTX ROM Intrauterine pregnancy: [redacted]w[redacted]d     Secondary diagnosis:  Principal Problem:   SVD (spontaneous vaginal delivery) Active Problems:   Indication for care in labor or delivery   Second-degree perineal laceration, with delivery  Additional problems: None     Discharge diagnosis: Term Pregnancy Delivered                                                                                                Post partum procedures:None  Augmentation: None  Complications: None  Hospital course:  Onset of Labor With Vaginal Delivery     35 y.o. yo [id number] at [redacted]w[redacted]d was admitted in Active Labor on 03/21/2016. Patient had an uncomplicated labor course as follows:  Membrane Rupture Time/Date: 12:15 AM ,03/21/2016   Intrapartum Procedures: Episiotomy: None [1]                                         Lacerations:  2nd degree [3]  Patient had a delivery of a Viable infant. 03/21/2016  Information for the patient's newborn:  Lugene, Hitt Taylin [[id number]]  Delivery Method: Vaginal, Spontaneous Delivery (Filed from Delivery Summary)    Pateint had an uncomplicated postpartum course.  She is ambulating, tolerating a regular diet, passing flatus, and urinating well. Patient is discharged home in stable condition on 03/22/2016.    Physical exam  Filed Vitals:   03/21/16 0615 03/21/16 1053 03/21/16 1826 03/22/16 0700  BP: 100/69 103/58 105/68 104/73  Pulse: 78 72 71 64  Temp: 97.6 F (36.4 C) 97.9 F (36.6 C) 97.4 F (36.3 C)   TempSrc: Oral Oral Oral   Resp: Height:      Weight:      SpO2: 97% 98%     General: alert, cooperative and no distress Lochia: appropriate Uterine Fundus: firm Incision: N/A DVT Evaluation: No evidence of DVT seen on physical  exam. Labs: Lab Results  Component Value Date   WBC 10.2 03/22/2016   HGB 11.4* 03/22/2016   HCT 33.7* 03/22/2016   MCV 86.0 03/22/2016   PLT 200 03/22/2016   No flowsheet data found.  Discharge instruction: per After Visit Summary and "Baby and Me Booklet".  After visit meds:    Medication List    STOP taking these medications        acetaminophen 325 MG tablet  Commonly known as:  TYLENOL      TAKE these medications        ibuprofen 600 MG tablet  Commonly known as:  ADVIL,MOTRIN  Take 1 tablet (600 mg total) by mouth every 6 (six) hours as needed.     oxyCODONE-acetaminophen 5-325 MG tablet  Commonly known as:  ROXICET  Take 1-2 tablets by mouth every 6 (six) hours as needed for severe  pain.     prenatal multivitamin Tabs tablet  Take 1 tablet by mouth daily at 12 noon.     PROCTOZONE-HC 2.5 % rectal cream  Generic drug:  hydrocortisone  Apply 1 application topically 2 (two) times daily as needed for hemorrhoids.     ranitidine 75 MG tablet  Commonly known as:  ZANTAC  Take 75 mg by mouth 2 (two) times daily.        Diet: routine diet  Activity: Advance as tolerated. Pelvic rest for 6 weeks.   Outpatient follow up:6 weeks Follow up Appt:Future Appointments Date Time Provider Department Center  03/24/2016 12:00 AM [facility] ROOM [facility] None   Follow up Visit:No Follow-up on file.  Postpartum contraception: Not Discussed  Newborn Data: Live born female  Birth Weight: 8 lb 2.5 oz (3700 g) APGAR: 8, 9  Baby Feeding: Breast Disposition:home with mother   03/22/2016 Jessee AversOLE,Cade Dashner J., MD

## 2016-03-22 NOTE — Discharge Instructions (Signed)
Vaginal Delivery °During delivery, your health care provider will help you give birth to your baby. During a vaginal delivery, you will work to push the baby out of your vagina. However, before you can push your baby out, a few things need to happen. The opening of your uterus (cervix) has to soften, thin out, and open up (dilate) all the way to 10 cm. Also, your baby has to move down from the uterus into your vagina.  °SIGNS OF LABOR  °Your health care provider will first need to make sure you are in labor. Signs of labor include:  °· Passing what is called the mucous plug before labor begins. This is a small amount of blood-stained mucus. °· Having regular, painful uterine contractions.   °· The time between contractions gets shorter.   °· The discomfort and pain gradually get more intense. °· Contraction pains get worse when walking and do not go away when resting.   °· Your cervix becomes thinner (effacement) and dilates. °BEFORE THE DELIVERY °Once you are in labor and admitted into the hospital or care center, your health care provider may do the following:  °· Perform a complete physical exam. °· Review any complications related to pregnancy or labor.  °· Check your blood pressure, pulse, temperature, and heart rate (vital signs).   °· Determine if, and when, the rupture of amniotic membranes occurred. °· Do a vaginal exam (using a sterile glove and lubricant) to determine:   °· The position (presentation) of the baby. Is the baby's head presenting first (vertex) in the birth canal (vagina), or are the feet or buttocks first (breech)?   °· The level (station) of the baby's head within the birth canal.   °· The effacement and dilatation of the cervix.   °· An electronic fetal monitor is usually placed on your abdomen when you first arrive. This is used to monitor your contractions and the baby's heart rate. °· When the monitor is on your abdomen (external fetal monitor), it can only pick up the frequency and  length of your contractions. It cannot tell the strength of your contractions. °· If it becomes necessary for your health care provider to know exactly how strong your contractions are or to see exactly what the baby's heart rate is doing, an internal monitor may be inserted into your vagina and uterus. Your health care provider will discuss the benefits and risks of using an internal monitor and obtain your permission before inserting the device. °· Continuous fetal monitoring may be needed if you have an epidural, are receiving certain medicines (such as oxytocin), or have pregnancy or labor complications. °· An IV access tube may be placed into a vein in your arm to deliver fluids and medicines if necessary. °THREE STAGES OF LABOR AND DELIVERY °Normal labor and delivery is divided into three stages. °First Stage °This stage starts when you begin to contract regularly and your cervix begins to efface and dilate. It ends when your cervix is completely open (fully dilated). The first stage is the longest stage of labor and can last from 3 hours to 15 hours.  °Several methods are available to help with labor pain. You and your health care provider will decide which option is best for you. Options include:  °· Opioid medicines. These are strong pain medicines that you can get through your IV tube or as a shot into your muscle. These medicines lessen pain but do not make it go away completely.  °· Epidural. A medicine is given through a thin tube that   is inserted in your back. The medicine numbs the lower part of your body and prevents any pain in that area. °· Paracervical pain medicine. This is an injection of an anesthetic on each side of your cervix.   °· You may request natural childbirth, which does not involve the use of pain medicines or an epidural during labor and delivery. Instead, you will use other things, such as breathing exercises, to help cope with the pain. °Second Stage °The second stage of labor  begins when your cervix is fully dilated at 10 cm. It continues until you push your baby down through the birth canal and the baby is born. This stage can take only minutes or several hours. °· The location of your baby's head as it moves through the birth canal is reported as a number called a station. If the baby's head has not started its descent, the station is described as being at minus 3 (-3). When your baby's head is at the zero station, it is at the middle of the birth canal and is engaged in the pelvis. The station of your baby helps indicate the progress of the second stage of labor. °· When your baby is born, your health care provider may hold the baby with his or her head lowered to prevent amniotic fluid, mucus, and blood from getting into the baby's lungs. The baby's mouth and nose may be suctioned with a small bulb syringe to remove any additional fluid. °· Your health care provider may then place the baby on your stomach. It is important to keep the baby from getting cold. To do this, the health care provider will dry the baby off, place the baby directly on your skin (with no blankets between you and the baby), and cover the baby with warm, dry blankets.   °· The umbilical cord is cut. °Third Stage °During the third stage of labor, your health care provider will deliver the placenta (afterbirth) and make sure your bleeding is under control. The delivery of the placenta usually takes about 5 minutes but can take up to 30 minutes. After the placenta is delivered, a medicine may be given either by IV or injection to help contract the uterus and control bleeding. If you are planning to breastfeed, you can try to do so now. °After you deliver the placenta, your uterus should contract and get very firm. If your uterus does not remain firm, your health care provider will massage it. This is important because the contraction of the uterus helps cut off bleeding at the site where the placenta was attached  to your uterus. If your uterus does not contract properly and stay firm, you may continue to bleed heavily. If there is a lot of bleeding, medicines may be given to contract the uterus and stop the bleeding.  °  °This information is not intended to replace advice given to you by your health care provider. Make sure you discuss any questions you have with your health care provider. °  °Document Released: 09/11/2008 Document Revised: 12/24/2014 Document Reviewed: 07/30/2012 °[employer] Interactive Patient Education ©2016 [employer] ° °Vaginal Delivery, Care After °Refer to this sheet in the next few weeks. These discharge instructions provide you with information on caring for yourself after delivery. Your health care provider may also give you specific instructions. Your treatment has been planned according to the most current medical practices available, but problems sometimes occur. Call your health care provider if you have any problems or questions after you   go home. °HOME CARE INSTRUCTIONS °· Take over-the-counter or prescription medicines only as directed by your health care provider or pharmacist. °· Do not drink alcohol, especially if you are breastfeeding or taking medicine to relieve pain. °· Do not chew or smoke tobacco. °· Do not use illegal drugs. °· Continue to use good perineal care. Good perineal care includes: °¨ Wiping your perineum from front to back. °¨ Keeping your perineum clean. °· Do not use tampons or douche until your health care provider says it is okay. °· Shower, wash your hair, and take tub baths as directed by your health care provider. °· Wear a well-fitting bra that provides breast support. °· Eat healthy foods. °· Drink enough fluids to keep your urine clear or pale yellow. °· Eat high-fiber foods such as whole grain cereals and breads, brown rice, beans, and fresh fruits and vegetables every day. These foods may help prevent or relieve constipation. °· Follow your health care  provider's recommendations regarding resumption of activities such as climbing stairs, driving, lifting, exercising, or traveling. °· Talk to your health care provider about resuming sexual activities. Resumption of sexual activities is dependent upon your risk of infection, your rate of healing, and your comfort and desire to resume sexual activity. °· Try to have someone help you with your household activities and your newborn for at least a few days after you leave the hospital. °· Rest as much as possible. Try to rest or take a nap when your newborn is sleeping. °· Increase your activities gradually. °· Keep all of your scheduled postpartum appointments. It is very important to keep your scheduled follow-up appointments. At these appointments, your health care provider will be checking to make sure that you are healing physically and emotionally. °SEEK MEDICAL CARE IF:  °· You are passing large clots from your vagina. Save any clots to show your health care provider. °· You have a foul smelling discharge from your vagina. °· You have trouble urinating. °· You are urinating frequently. °· You have pain when you urinate. °· You have a change in your bowel movements. °· You have increasing redness, pain, or swelling near your vaginal incision (episiotomy) or vaginal tear. °· You have pus draining from your episiotomy or vaginal tear. °· Your episiotomy or vaginal tear is separating. °· You have painful, hard, or reddened breasts. °· You have a severe headache. °· You have blurred vision or see spots. °· You feel sad or depressed. °· You have thoughts of hurting yourself or your newborn. °· You have questions about your care, the care of your newborn, or medicines. °· You are dizzy or light-headed. °· You have a rash. °· You have nausea or vomiting. °· You were breastfeeding and have not had a menstrual period within 12 weeks after you stopped breastfeeding. °· You are not breastfeeding and have not had a menstrual  period by the 12th week after delivery. °· You have a fever. °SEEK IMMEDIATE MEDICAL CARE IF:  °· You have persistent pain. °· You have chest pain. °· You have shortness of breath. °· You faint. °· You have leg pain. °· You have stomach pain. °· Your vaginal bleeding saturates two or more sanitary pads in 1 hour. °  °This information is not intended to replace advice given to you by your health care provider. Make sure you discuss any questions you have with your health care provider. °  °Document Released: 11/30/2000 Document Revised: 08/24/2015 Document Reviewed: 07/30/2012 °[employer] Interactive Patient   Education ©2016 [employer] ° °

## 2016-03-24 ENCOUNTER — Inpatient Hospital Stay (HOSPITAL_COMMUNITY): Admission: RE | Admit: 2016-03-24 | Payer: BLUE CROSS/BLUE SHIELD | Source: Ambulatory Visit

## 2016-09-23 IMAGING — CT CT ABDOMEN W/ CM
3 of 4 series · 13 of 36 positions shown, 19 images · IV contrast (READICAT/WATER & [ID] ISOVUE 300)
Comparison: None.

CLINICAL DATA: Postprandial epigastric abdominal pain.

EXAM:
CT ABDOMEN WITH CONTRAST
TECHNIQUE: Multidetector CT imaging of the abdomen was performed using the
standard protocol following bolus administration of intravenous
contrast.
CONTRAST:  100 mL Isovue 300

[Series 3: abdomen with · axial · 0.57mm/px · z∈[-205,-25]mm · 7 of 49 slices shown, 12 images]
[im 7/49  soft-tissue]
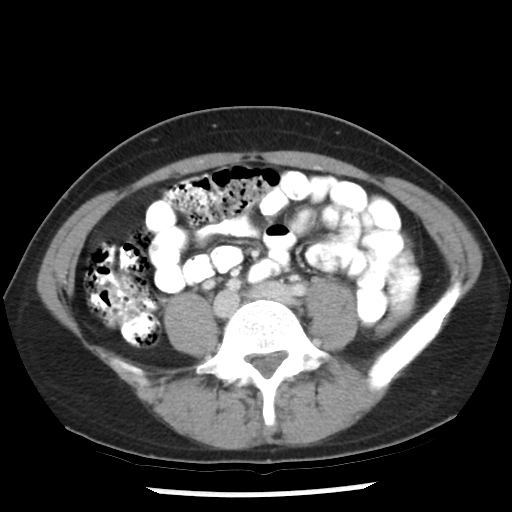
[im 7/49  bone]
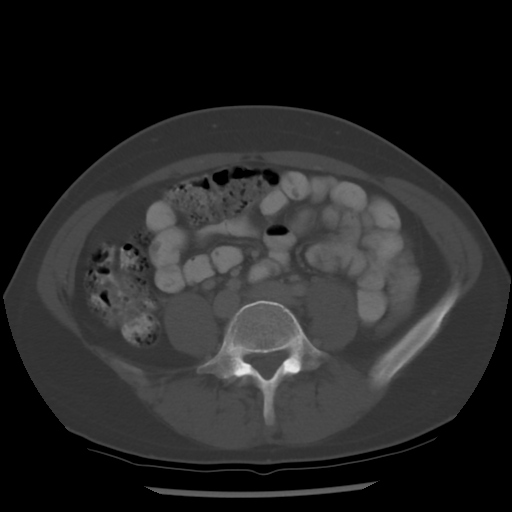
[im 13/49  soft-tissue]
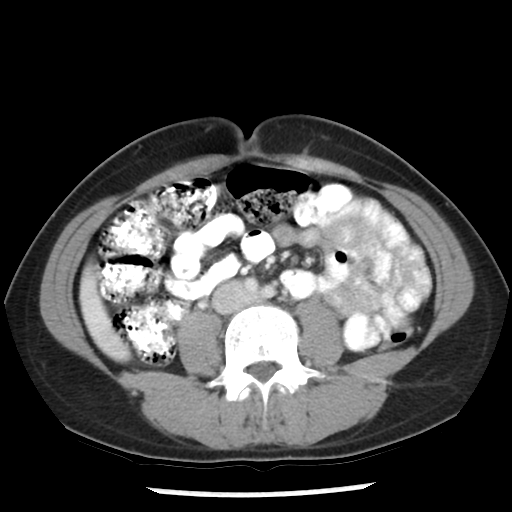
[im 19/49  soft-tissue]
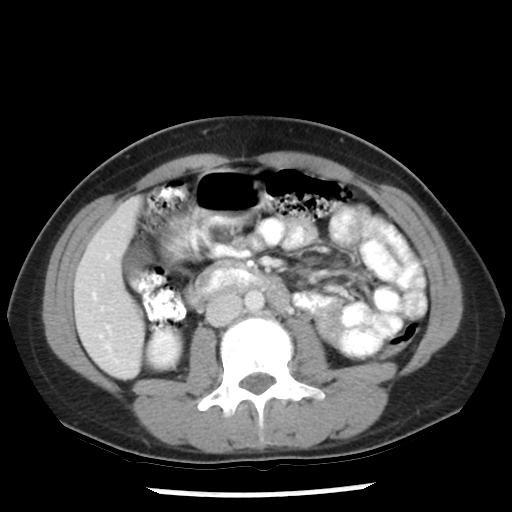
[im 25/49  soft-tissue]
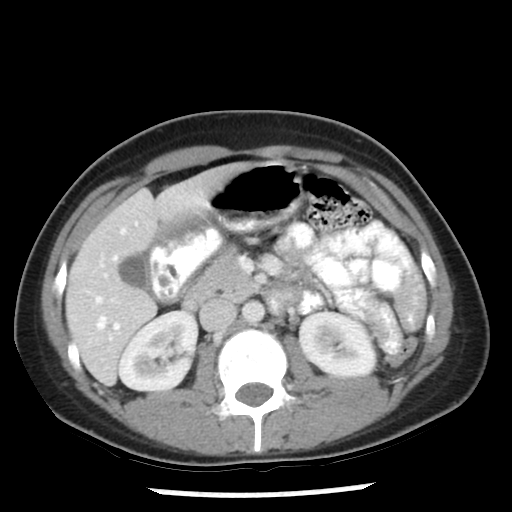
[im 25/49  lung]
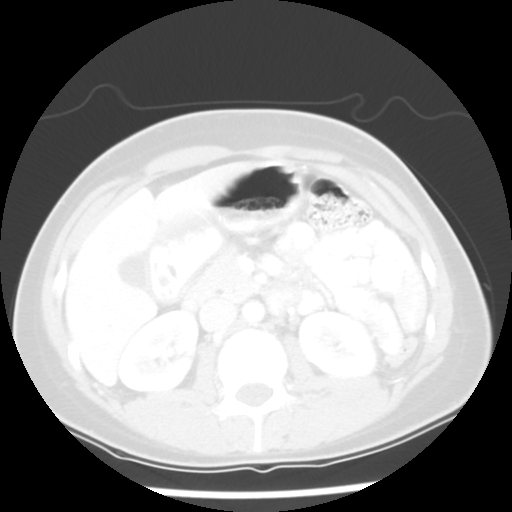
[im 31/49  soft-tissue]
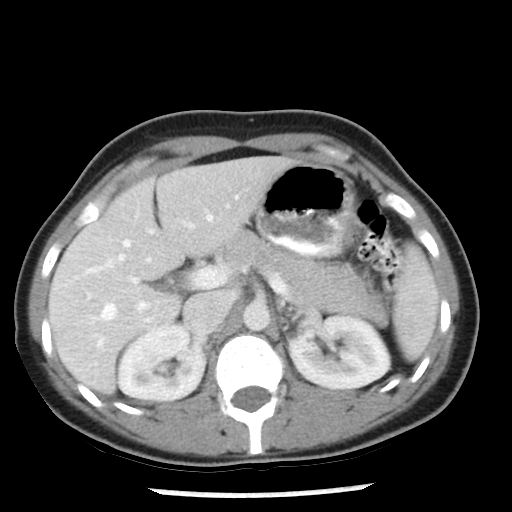
[im 31/49  lung]
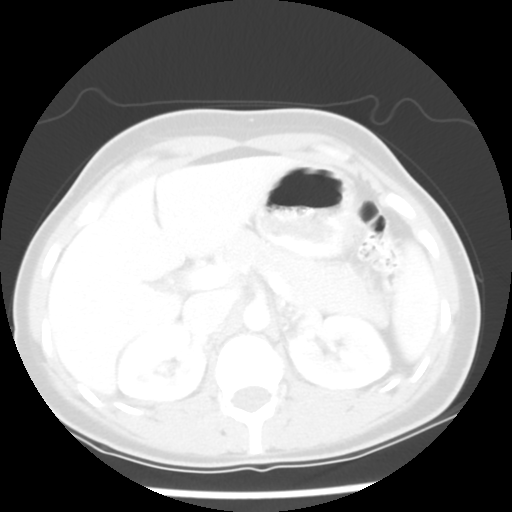
[im 37/49  soft-tissue]
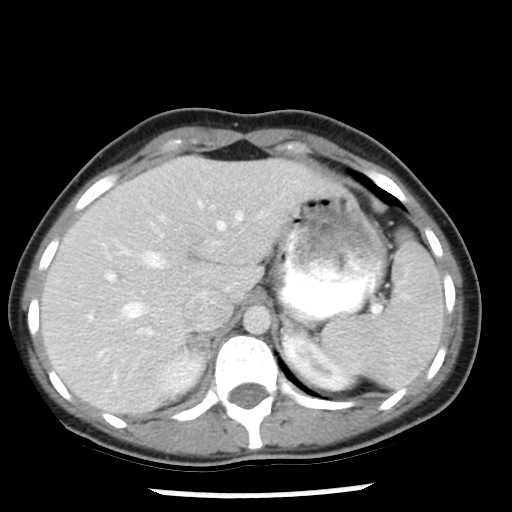
[im 37/49  lung]
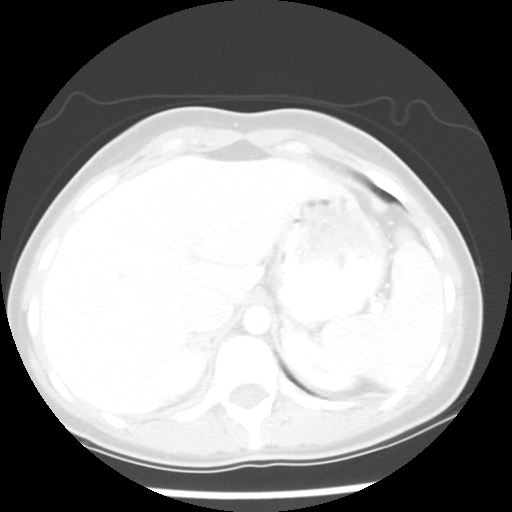
[im 43/49  soft-tissue]
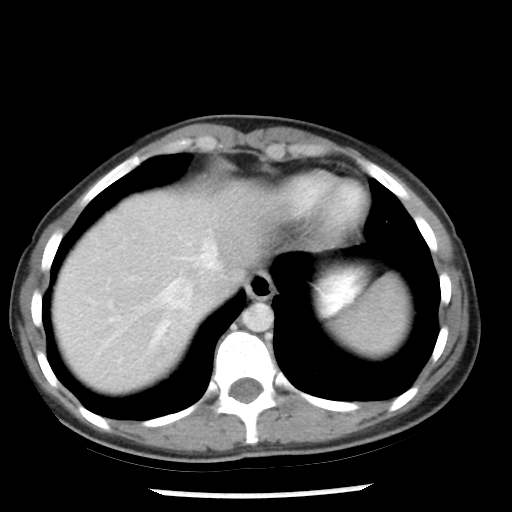
[im 43/49  lung]
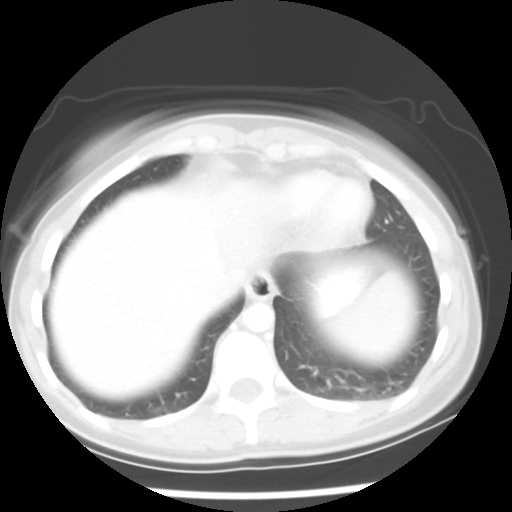

[Series 601: coronal body · coronal · 0.57mm/px · 1 of 89 slices shown, 2 images]
[im 30/89  soft-tissue]
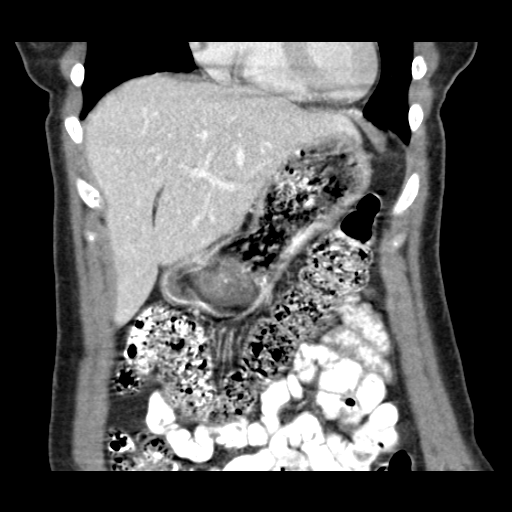
[im 30/89  bone]
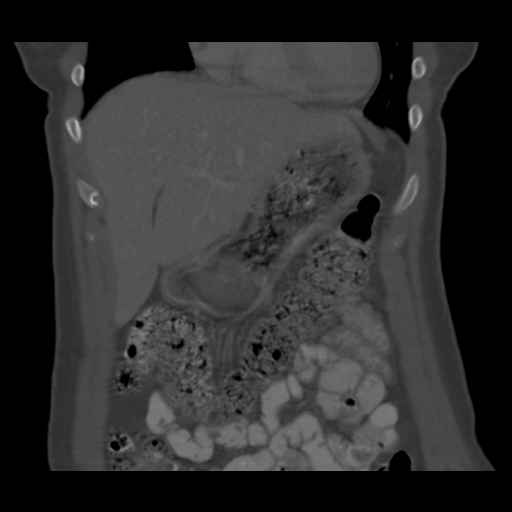

[Series 602: sagittal body · sagittal · 0.57mm/px · 5 of 117 slices shown]
[im 13/117  soft-tissue]
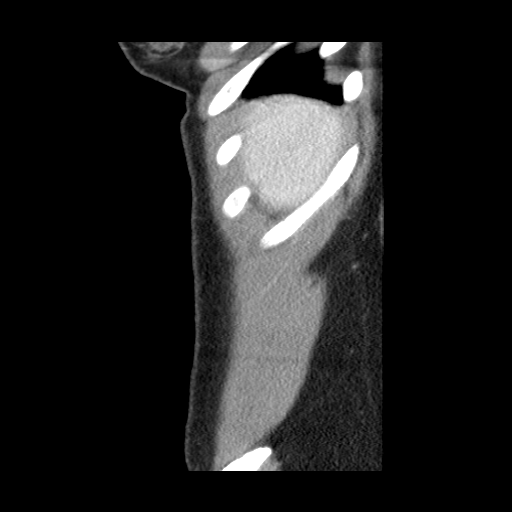
[im 25/117  soft-tissue]
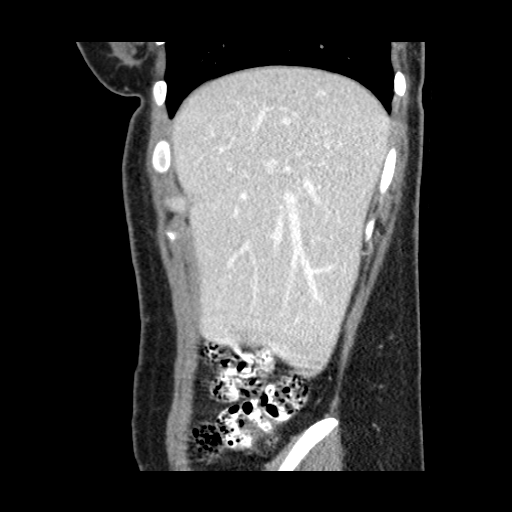
[im 37/117  soft-tissue]
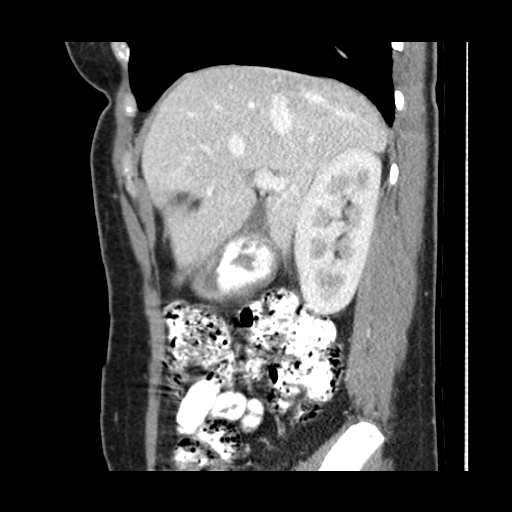
[im 49/117  soft-tissue]
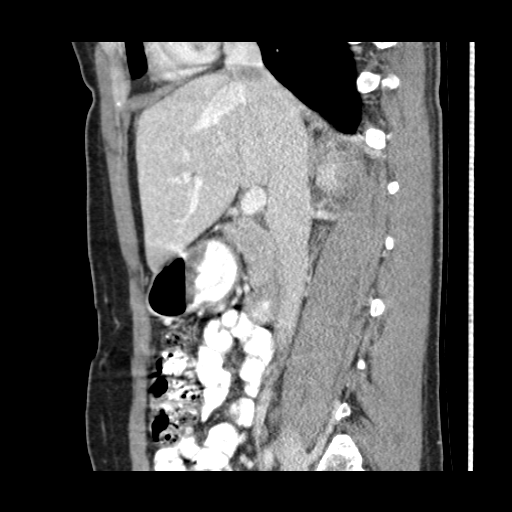
[im 68/117  soft-tissue]
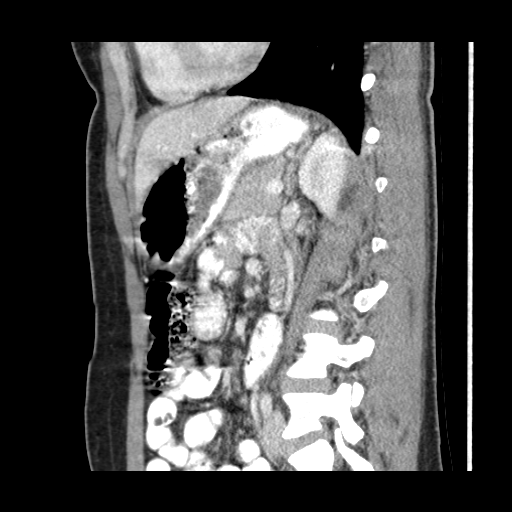

[13 of 36 positions shown; findings below may reference images not displayed]

FINDINGS: Lower chest:  Unremarkable.

Hepatobiliary: No mass or other parenchymal abnormality identified.

Pancreas: No mass, inflammatory changes, or other parenchymal
abnormality identified.

Spleen:  Within normal limits in size and appearance.

Adrenal Glands:  No mass identified.

Kidneys:  No masses identified.  No evidence of hydronephrosis.

Stomach/Bowel/Peritoneum: Visualized portions within the abdomen are
unremarkable.

Vascular/Lymphatic: No pathologically enlarged lymph nodes
identified. No other significant abnormality noted.

Other:  None.

Musculoskeletal:  No suspicious bone lesions identified.
IMPRESSION: Negative abdomen CT. No acute findings or other significant
abnormality identified.
